# Patient Record
Sex: Male | Born: 1970 | Race: White | Hispanic: No | Marital: Married
Health system: Midwestern US, Community
[De-identification: ages and names within clinical notes are randomized; demographics above are authoritative.]

---

## 2015-07-15 ENCOUNTER — Inpatient Hospital Stay
Admit: 2015-07-15 | Discharge: 2015-07-15 | Disposition: A | Discharge status: Home / Self Care | Payer: TRICARE (CHAMPUS) | Attending: Emergency Medicine

## 2015-07-15 DIAGNOSIS — M75101 Unspecified rotator cuff tear or rupture of right shoulder, not specified as traumatic: Secondary | ICD-10-CM

## 2015-07-15 MED ORDER — DIAZEPAM 5 MG TAB
5 mg | ORAL | Status: AC
Start: 2015-07-15 — End: 2015-07-15
  Administered 2015-07-15: 21:00:00 via ORAL

## 2015-07-15 MED ORDER — NAPROXEN 500 MG TAB
500 mg | ORAL_TABLET | Freq: Two times a day (BID) | ORAL | 0 refills | Status: AC
Start: 2015-07-15 — End: 2015-07-25

## 2015-07-15 MED ORDER — CYCLOBENZAPRINE 5 MG TAB
5 mg | ORAL_TABLET | Freq: Three times a day (TID) | ORAL | 0 refills | Status: AC | PRN
Start: 2015-07-15 — End: ?

## 2015-07-15 MED FILL — DIAZEPAM 5 MG TAB: 5 mg | ORAL | Qty: 1

## 2015-07-15 NOTE — ED Triage Notes (Signed)
Patient arrived with c/o right upper back pain since Saturday.  [facility], has been taking OTC medications and can't sleep- "wants pain gone".

## 2015-07-15 NOTE — ED Notes (Signed)
Patient discharged by md. pt given the opportunity to ask questions, verbalized understanding of teaching.

## 2015-07-15 NOTE — ED Provider Notes (Signed)
HPI Comments: 44 y.o. male with no significant past medical history who presents from home via POV with chief complaint of back pain. Pt reports that he's been experiencing pain around the right shoulder blade since the evening of 07/12/15. He relates the pain is worse with deep breathing and turning his head to the right. Pt had chiropractor adjustments on 07/13/15 as well as today without relief. He denies any precipitating injury, leg swelling, or SOB. Pt notes that he commutes 5 hours today. Denies hx of VTE. There are no other acute medical concerns at this time.    Significant FMHx: -fhx of clots  PCP: None    Note written by Lillia Mountain, Scribe, as dictated by Tollie Pizza. Gemayel Mascio, MD 4:01 PM    The history is provided by the patient. No language interpreter was used.        No past medical history on file.    No past surgical history on file.      No family history on file.    Social History     Social History   ??? Marital status: MARRIED     Spouse name: N/A   ??? Number of children: N/A   ??? Years of education: N/A     Occupational History   ??? Not on file.     Social History Main Topics   ??? Smoking status: Not on file   ??? Smokeless tobacco: Not on file   ??? Alcohol use Not on file   ??? Drug use: Not on file   ??? Sexual activity: Not on file     Other Topics Concern   ??? Not on file     Social History Narrative         ALLERGIES: Review of patient's allergies indicates no known allergies.    Review of Systems   Constitutional: Negative for chills and fever.   HENT: Negative for ear pain and sore throat.    Eyes: Negative for pain.   Respiratory: Negative for chest tightness and shortness of breath.    Cardiovascular: Negative for chest pain and leg swelling.   Gastrointestinal: Negative for abdominal pain, nausea and vomiting.   Genitourinary: Negative for dysuria and flank pain.   Musculoskeletal: Positive for back pain.   Skin: Negative for rash.   Neurological: Negative for headaches.    All other systems reviewed and are negative.      Vitals:    07/15/15 1523   BP: 133/87   Pulse: 71   Resp: 16   Temp: 97.9 ??F (36.6 ??C)   SpO2: 97%   Weight: 84.4 kg (186 lb)   Height:  (1.778 m)            Physical Exam   Constitutional: He is oriented to person, place, and time. He appears well-developed and well-nourished. No distress.   HENT:   Head: Normocephalic and atraumatic.   Eyes: Pupils are equal, round, and reactive to light. No scleral icterus.   Neck: Normal range of motion. Neck supple. No tracheal deviation present.   Cardiovascular: Normal rate, regular rhythm and normal heart sounds.  Exam reveals no gallop and no friction rub.    No murmur heard.  Radial pulses 2+   Pulmonary/Chest: Effort normal and breath sounds normal. No respiratory distress. He has no wheezes. He has no rales.   Abdominal: Soft. He exhibits no distension. There is no tenderness. There is no rebound and no guarding.   Musculoskeletal: He  exhibits tenderness. He exhibits no edema.   Weakness in supraspinatus testing. Normal strength with internal and external rotation. -Hawkins. +Neer's.  Tenderness to medial scapula on the right.   Neurological: He is alert and oriented to person, place, and time.   Motor and sensation intact on radial and ulnar distribution. Nl strength.   Skin: Skin is warm and dry.   Psychiatric: He has a normal mood and affect.   Nursing note and vitals reviewed.     Note written by Lillia Mountainhristopher D. Gatens, Scribe, as dictated by Tollie Pizzaimothy J. Antoine Primasitchner, MD 4:02 PM     MDM  ED Course       Procedures    Progress notes:  4:26 PM Will discharge to home with flexeril and naproxen to f/u with an orthopedist. Sling applied and valium administered here.    Orders:  Orders Placed This Encounter   ??? APPLY SLING;  SPECIFY ONE TIME STAT   ??? diazePAM (VALIUM) tablet 5 mg   ??? cyclobenzaprine (FLEXERIL) 5 mg tablet   ??? naproxen (NAPROSYN) 500 mg tablet     Disposition: Discharge  4:26 PM   Patient's results have been reviewed with them.  Patient and/or family have verbally conveyed their understanding and agreement of the patient's signs, symptoms, diagnosis, treatment and prognosis and additionally agree to follow up as recommended or return to the Emergency Room should their condition change prior to follow-up.  Discharge instructions have also been provided to the patient with some educational information regarding their diagnosis as well a list of reasons why they would want to return to the ER prior to their follow-up appointment should their condition change.       Impression: rotator cuff tendonitis, rhomboid muscle strain    Plain: Naprosyn, sling, follow up with Ortho

## 2020-06-10 IMAGING — MR MRI KNEE LT WO CONTRAST
4 of 5 series · 27 of 40 positions shown · non-contrast
Comparison: None available.

INDICATION: Left knee pain
TECHNIQUE: Multiplanar, multisequence imaging of the left knee was performed without contrast.

[Series 15: t2_axial_fs · axial · left · 3.0mm · 0.45mm/px · z∈[-28,+87]mm · 8 of 30 slices shown]
[im 1/30]
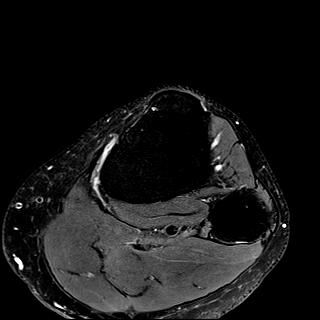
[im 4/30]
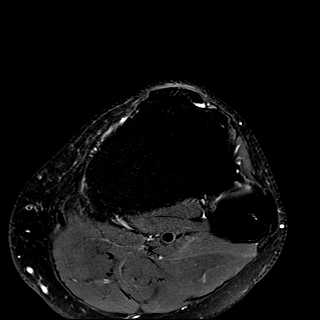
[im 10/30]
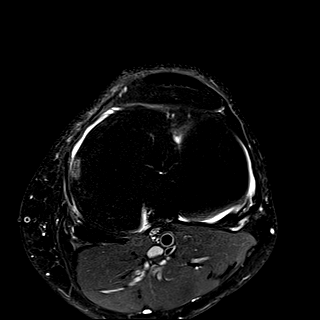
[im 13/30]
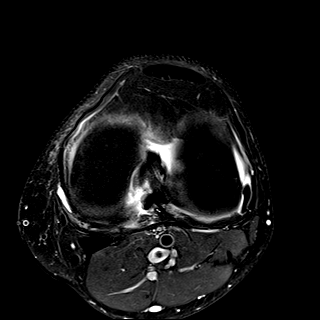
[im 17/30]
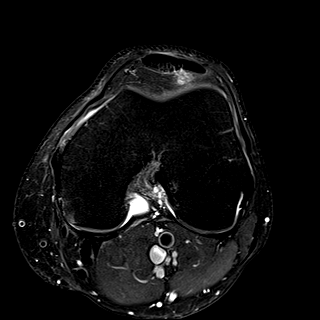
[im 20/30]
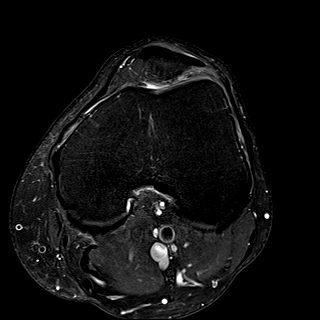
[im 26/30]
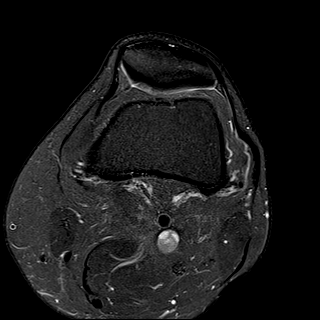
[im 30/30]
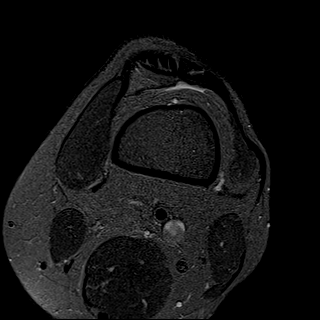

[Series 17: t1_cor · coronal · left · 3.5mm · 0.36mm/px · 7 of 24 slices shown]
[im 1/24]
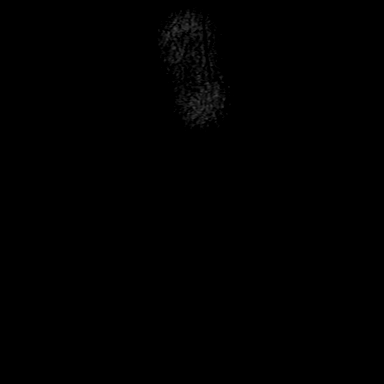
[im 4/24]
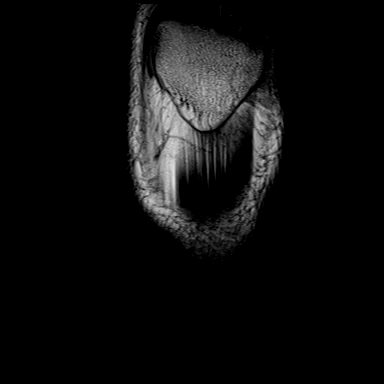
[im 8/24]
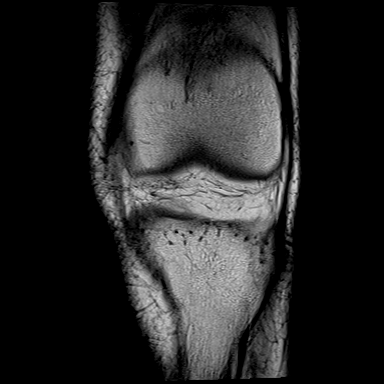
[im 12/24]
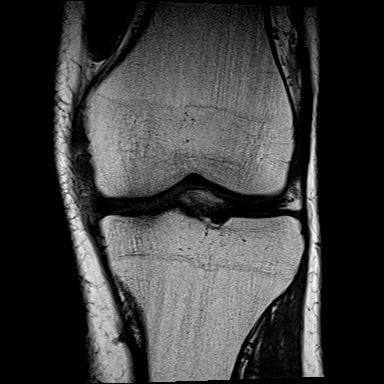
[im 16/24]
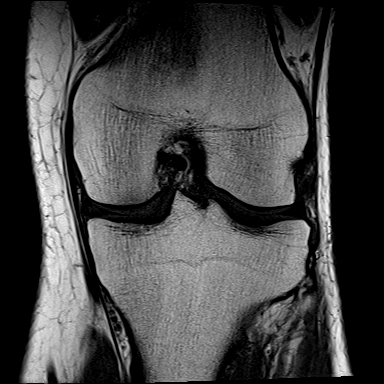
[im 20/24]
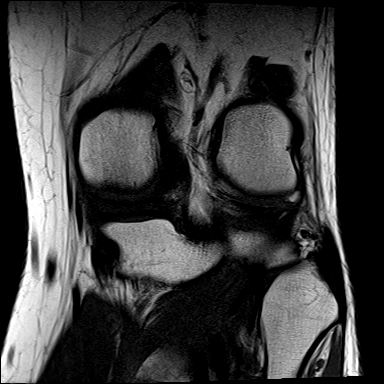
[im 24/24]
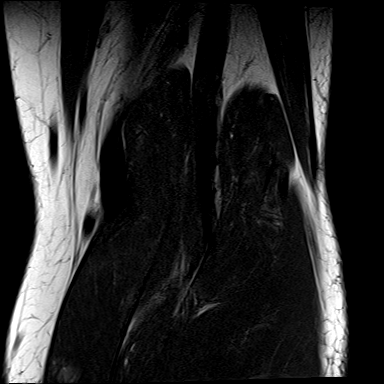

[Series 18: t2_cor_fs · coronal · left · 3.5mm · 0.44mm/px · 7 of 24 slices shown]
[im 1/24]
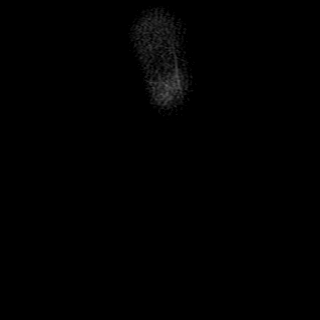
[im 4/24]
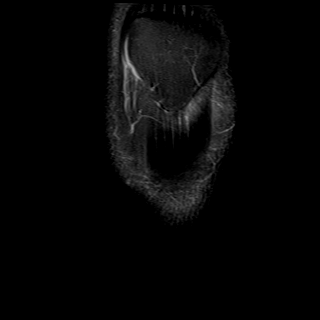
[im 8/24]
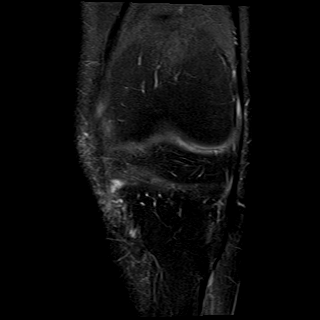
[im 12/24]
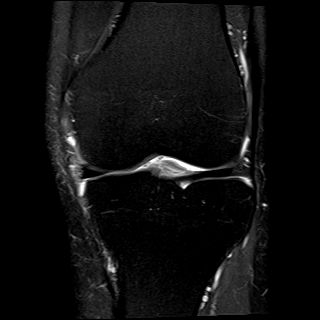
[im 16/24]
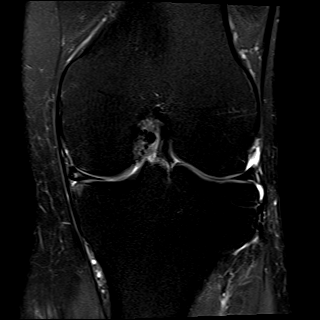
[im 20/24]
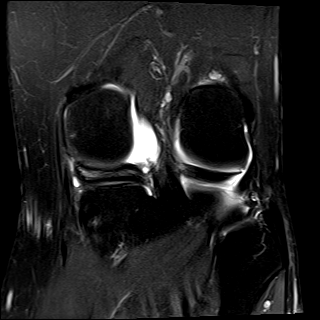
[im 24/24]
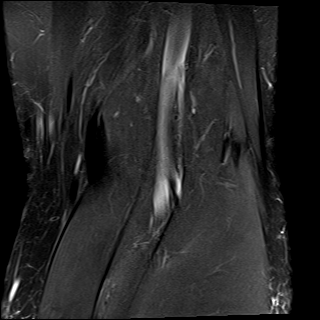

[Series 1019: pd_sag_fs · sagittal · left · 3.0mm · 0.23mm/px · 5 of 28 slices shown]
[im 1/28]
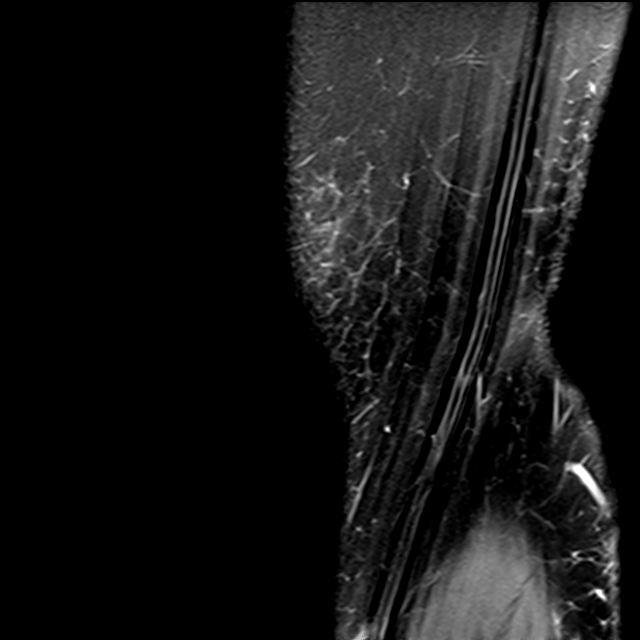
[im 4/28]
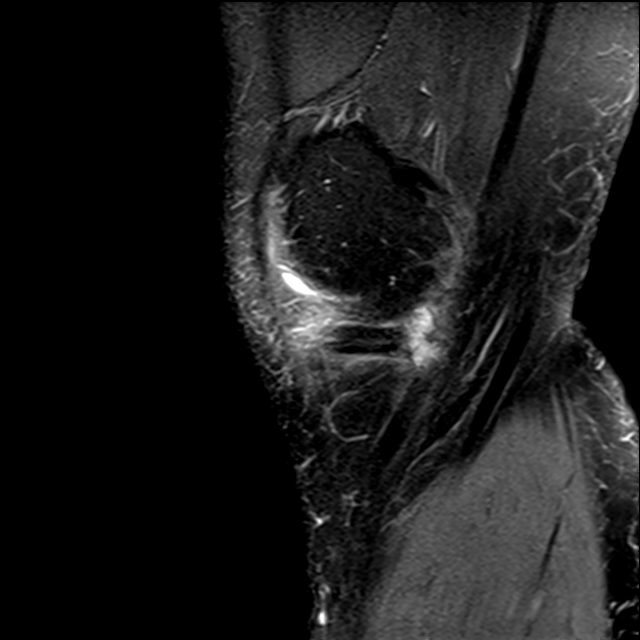
[im 8/28]
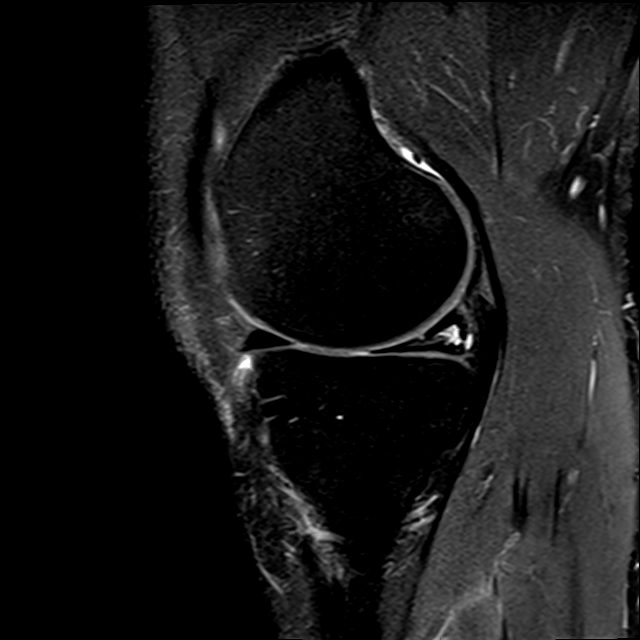
[im 16/28]
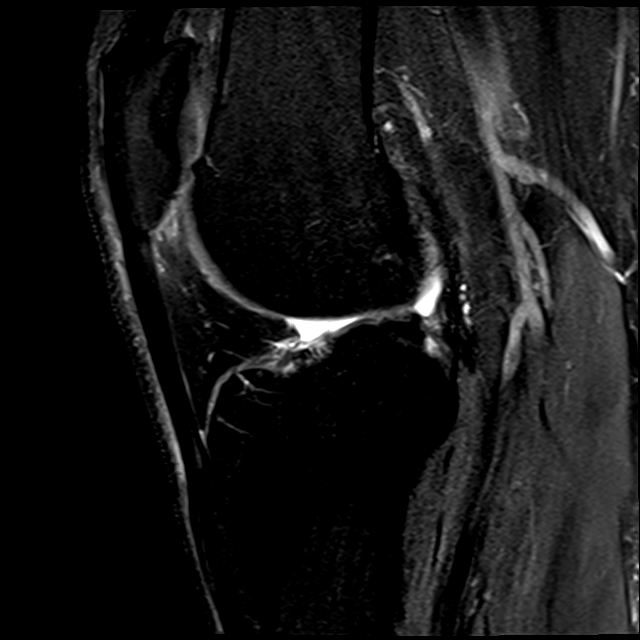
[im 24/28]
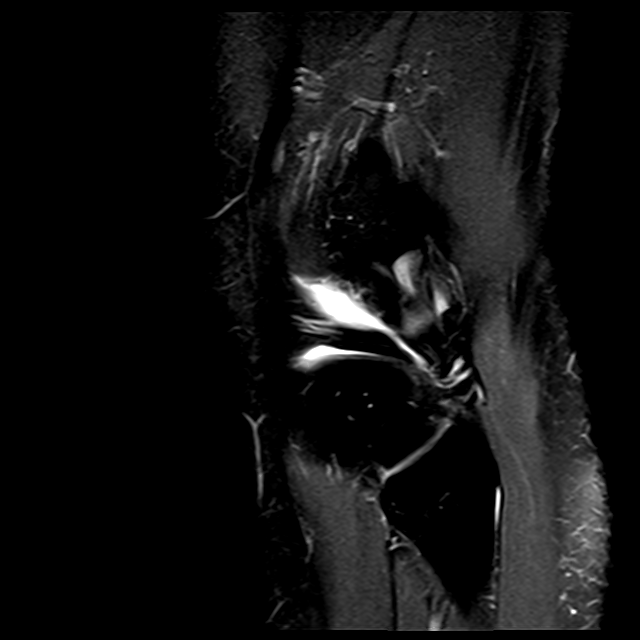

[27 of 40 positions shown; findings below may reference images not displayed]

FINDINGS: OSSEOUS: No acute fracture, avascular necrosis or aggressive osseous lesion.

MEDIAL JOINT COMPARTMENT:

Medial meniscus: Horizontal tearing of the posterior horn of the medial meniscus is present, with a multiloculated parameniscal cyst measuring 22 x 12 mm. Background myxoid degeneration of the posterior horn and body of the medial meniscus. Additional small superior surface tear of the anterior body of the medial meniscus.

Articular cartilage: Small region of high-grade chondrosis along the peripheral aspect the weightbearing medial tibial plateau, subjacent to the medial meniscal body. Associated with mild underlying subchondral marrow edema.

LATERAL JOINT COMPARTMENT:

Lateral meniscus: Intact.

Articular cartilage: Intact.

PATELLOFEMORAL JOINT AND EXTENSOR MECHANISM:

Quadriceps tendon: The visualized portion of the distal quadriceps tendon is intact.

Patella tendon: Mild proximal patellar tendinosis.

Articular cartilage: Multifocal superficial partial-thickness chondral fissuring of the median patellar ridge and medial patellar facet, without underlying reactive osseous change.

Alignment: The alignment of the patellofemoral joint is within normal limits, in the imaged position.

LIGAMENTS:

Anterior cruciate ligament: Intact.

Posterior cruciate ligament: Intact.

Medial collateral ligament: Intact.

Lateral collateral ligament: Intact.

MUSCULOTENDINOUS: The visualized musculotendinous soft tissues about the knee are unremarkable.

OTHER: Small amount of knee joint fluid. Moderate focal edema is present within the superolateral aspect of Hoffa's fat. Trace fluid within the popliteal recess.
IMPRESSION: 1.
Large horizontal tear at the posterior horn of the medial meniscus, with associated parameniscal cyst. Mild medial compartment chondrosis.

2.
Mild patellofemoral joint chondrosis.

3.
Edema within the superolateral aspect of Hoffa's fat which can be seen in setting of patellar maltracking/Hoffa's fat pad impingement.

## 2020-07-29 IMAGING — CT CTA CHEST WO-W CONTRAST
3 of 7 series · 18 of 36 positions shown · IV contrast (agent unspecified)
Comparison: None.

HISTORY: 49-year-old male with chest pain, shortness of breath and palpitations.
TECHNIQUE: A CTA chest study was performed without and with IV contrast using axial images. Sagittal and coronal reconstructed images were obtained. Multiplanar MIP and 3-D reconstructions were created. 100 ml of Rsovue-0NB intravenous contrast was administered.

[Series 7: angio · axial · 0.76mm/px · z∈[+1514,+1781]mm · 14 of 309 slices shown]
[im 21/309  lung]
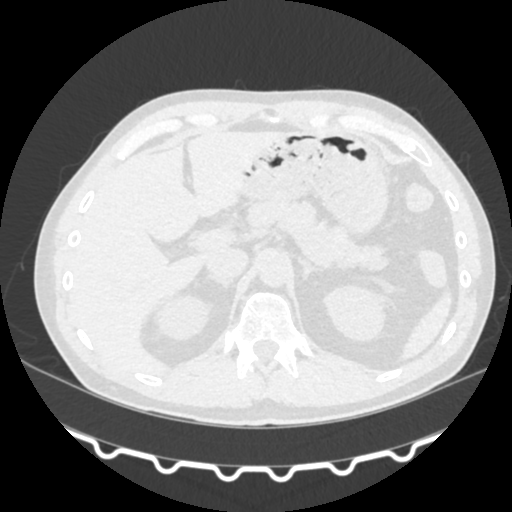
[im 42/309  mediastinal]
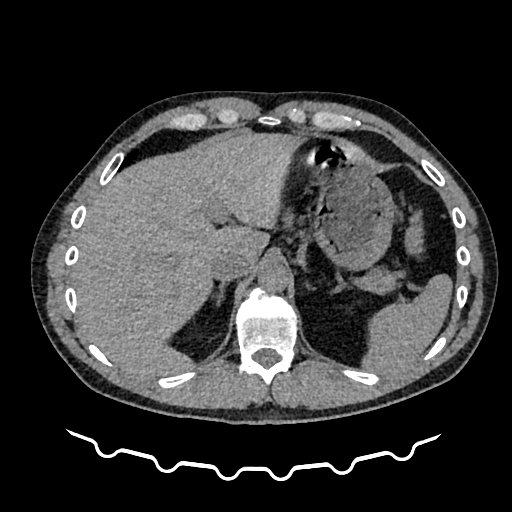
[im 62/309  lung]
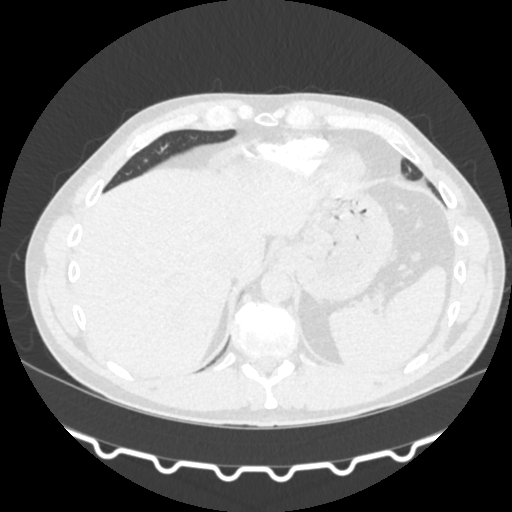
[im 83/309  mediastinal]
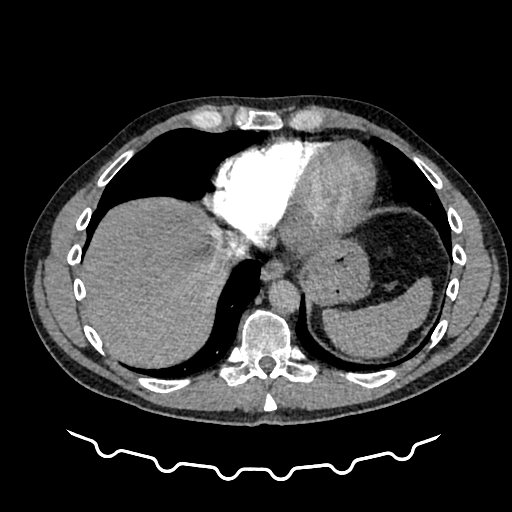
[im 103/309  lung]
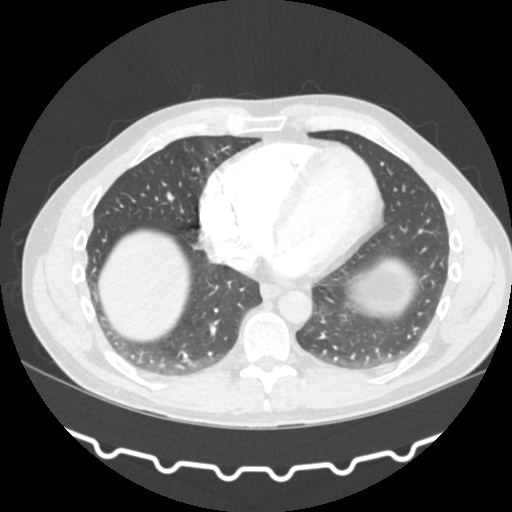
[im 124/309  mediastinal]
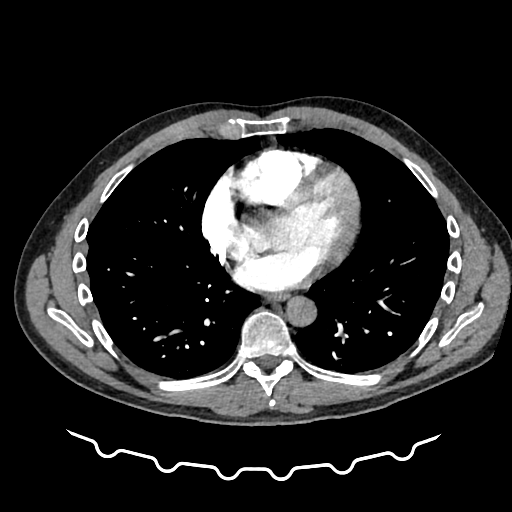
[im 144/309  lung]
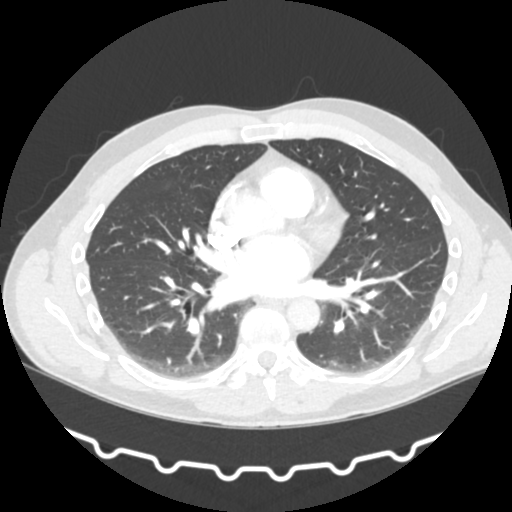
[im 165/309  mediastinal]
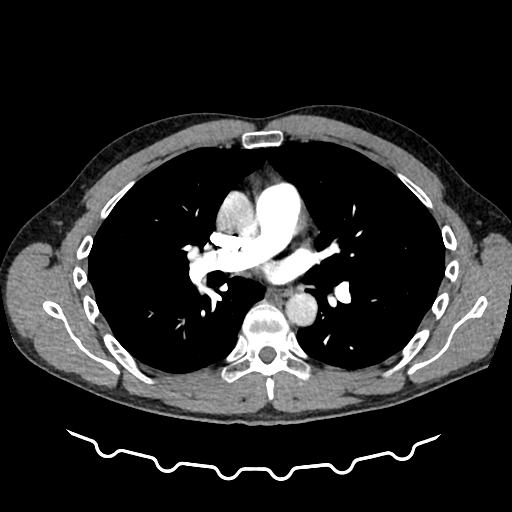
[im 185/309  lung]
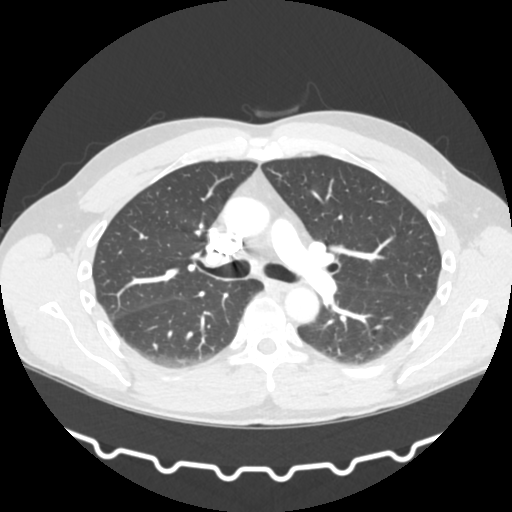
[im 206/309  mediastinal]
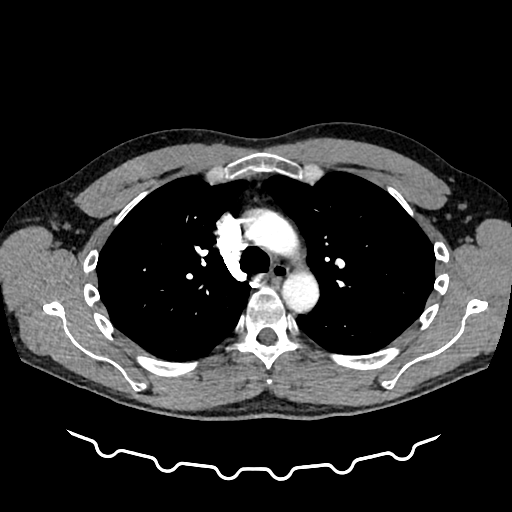
[im 226/309  lung]
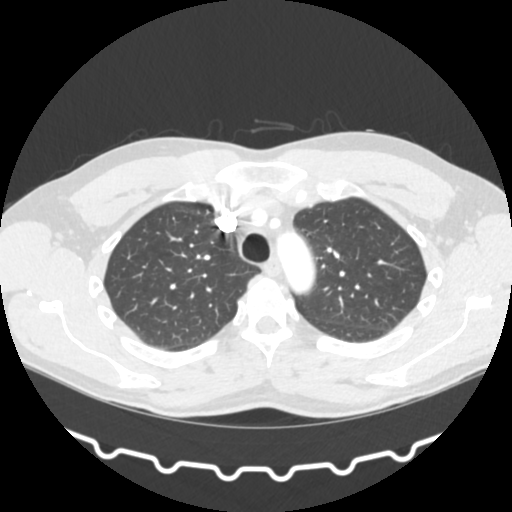
[im 247/309  mediastinal]
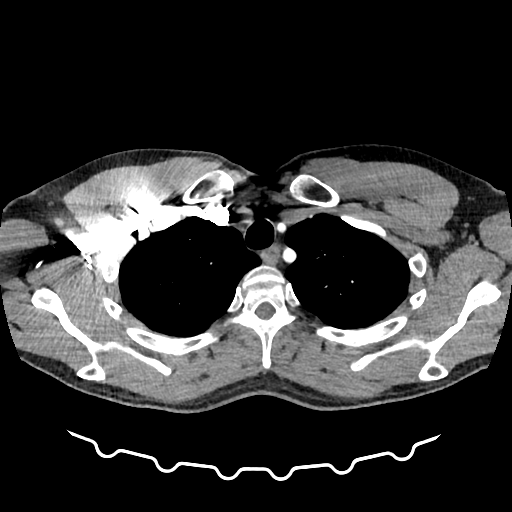
[im 267/309  lung]
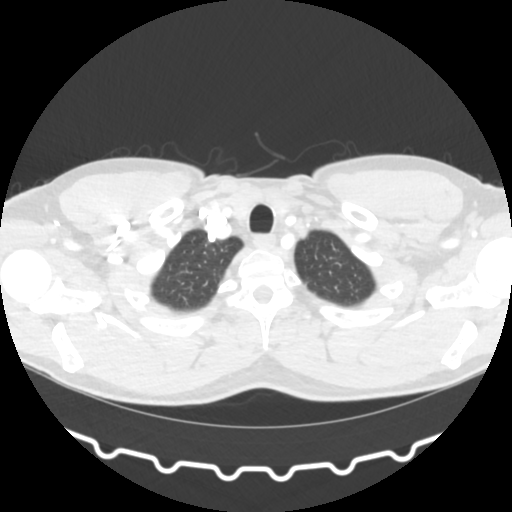
[im 288/309  mediastinal]
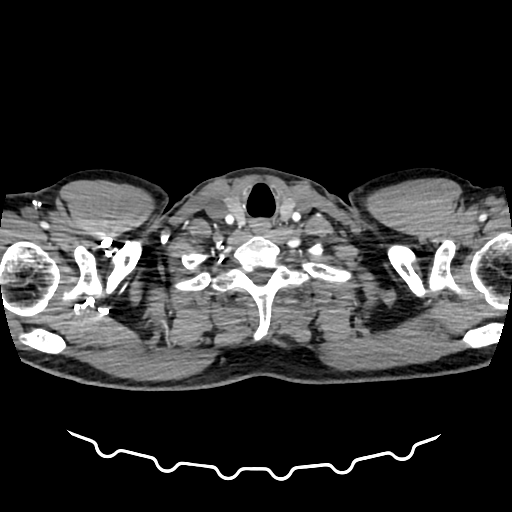

[Series 9: lung · axial · 0.55mm/px · 1 of 155 slices shown]
[im 23/155  mediastinal]
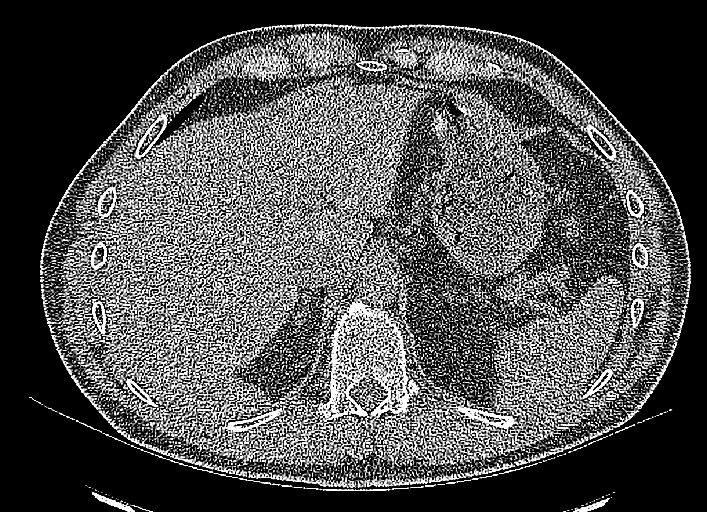

[Series 11: coronal · coronal · 0.61mm/px · 3 of 122 slices shown]
[im 25/122  mediastinal]
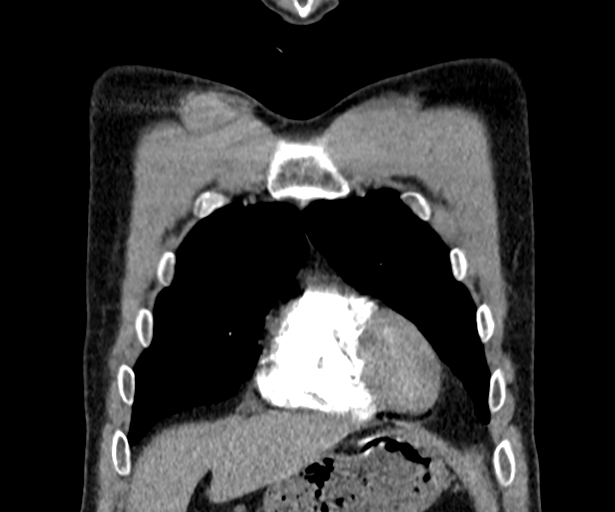
[im 49/122  mediastinal]
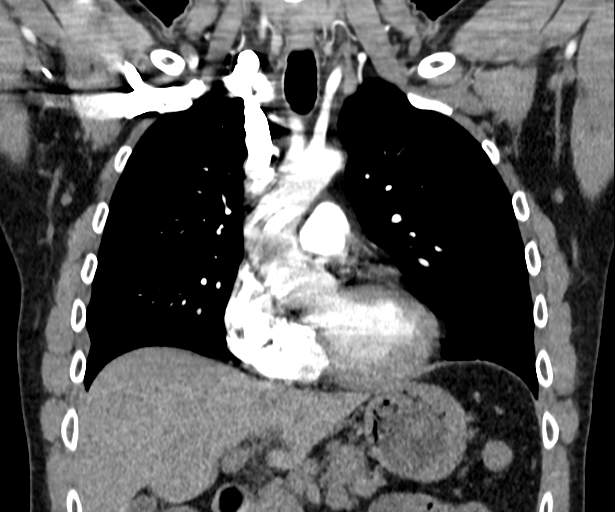
[im 73/122  mediastinal]
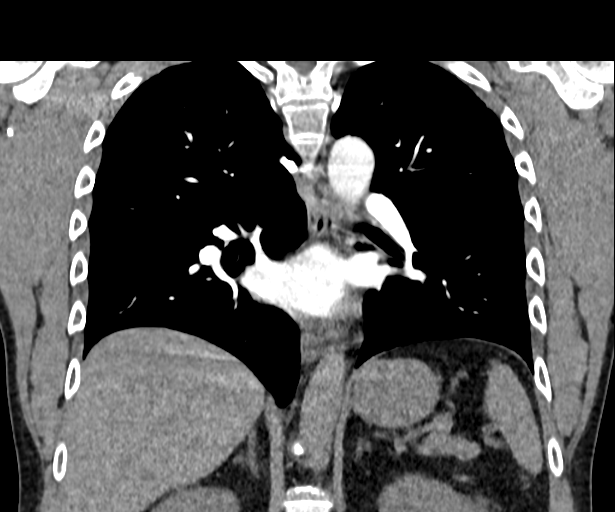

[18 of 36 positions shown; findings below may reference images not displayed]

FINDINGS: Visualized lower neck: The thyroid is unremarkable. No suspicious lesion.

Axilla: No lymphadenopathy.

Mediastinum/hila: No lymphadenopathy.

Cardiovascular: No cardiomegaly or pericardial effusion.

Pulmonary vessels: No filling defect to suggest embolism. No dilation of the pulmonary artery.

Upper abdomen: Nonobstructive calculus in the upper pole of the left kidney measuring 5 mm.

Osseous structures: No suspicious lesion or acute fracture. Bilateral cervical ribs. Chronic mild compression deformity of the superior endplate of T7.

Pleura: No pleural effusion or thickening. No pneumothorax.

Lungs: No pulmonary nodule, mass or consolidation.
IMPRESSION: No evidence of acute pulmonary embolism. No acute process.

Chronic mild compression deformity of T7.

Nonobstructive 5 mm left upper renal calculus.

Total radiation dose to patient is CTDIvol 11.64 mGy and DLP 288.86 mGy-cm.

STAT Fax

## 2021-05-11 IMAGING — MR MRI KNEE LT WO CONTRAST
4 of 5 series · 27 of 40 positions shown · non-contrast
Comparison: MRI of the left knee, 06/10/2020.

INDICATION: Meniscus tear
TECHNIQUE: Multiplanar, multisequence imaging of the left knee was performed without contrast.

[Series 8: t2_axial_fs · axial · left · 3.0mm · 0.47mm/px · z∈[-55,+76]mm · 8 of 34 slices shown]
[im 1/34]
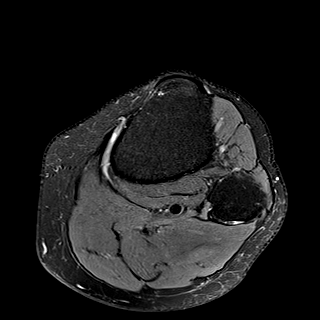
[im 4/34]
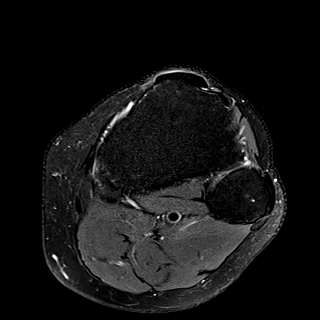
[im 12/34]
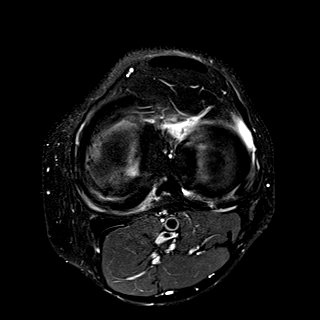
[im 15/34]
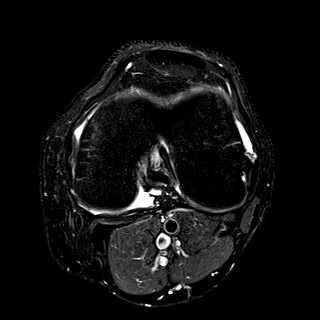
[im 19/34]
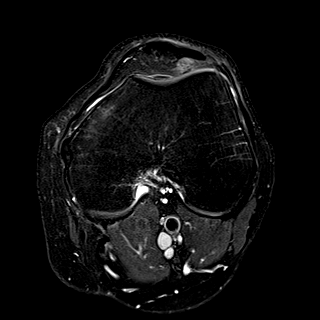
[im 23/34]
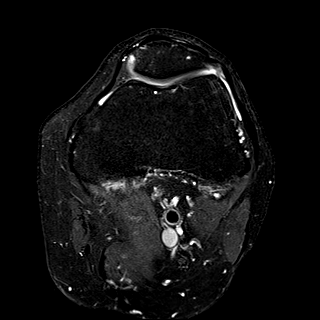
[im 30/34]
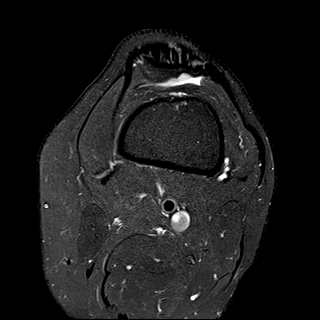
[im 34/34]
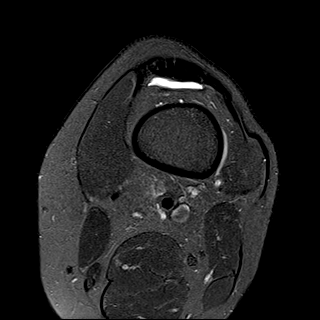

[Series 10: t1_cor · coronal · left · 3.5mm · 0.40mm/px · 7 of 28 slices shown]
[im 1/28]
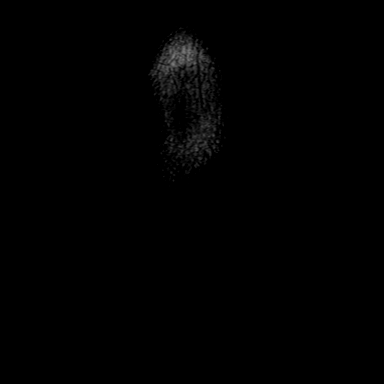
[im 5/28]
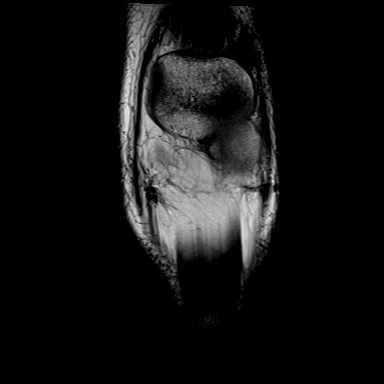
[im 10/28]
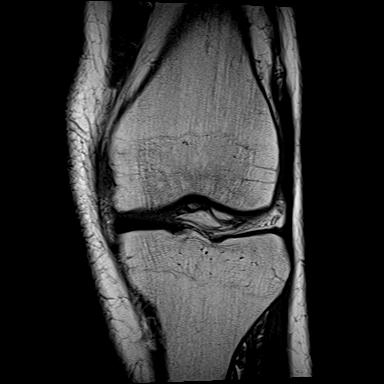
[im 14/28]
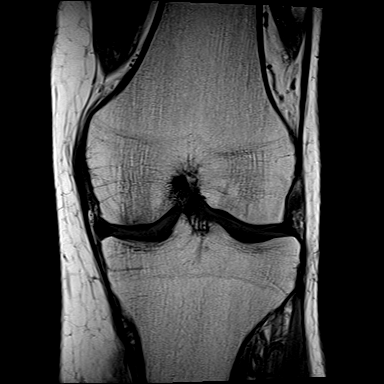
[im 19/28]
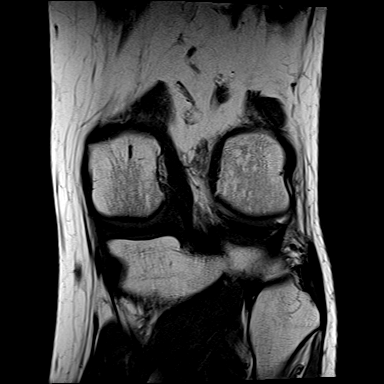
[im 23/28]
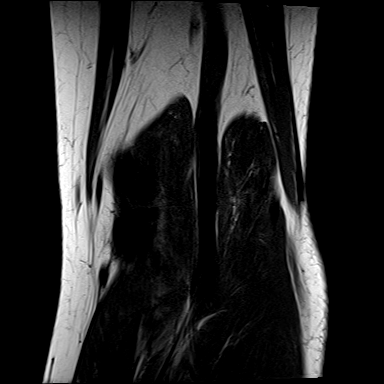
[im 28/28]
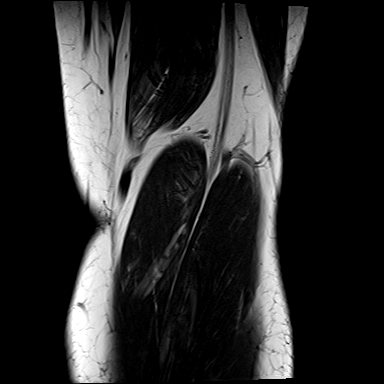

[Series 11: t2_cor_fs · coronal · left · 3.5mm · 0.48mm/px · 7 of 28 slices shown]
[im 1/28]
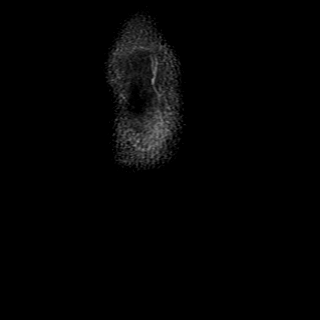
[im 5/28]
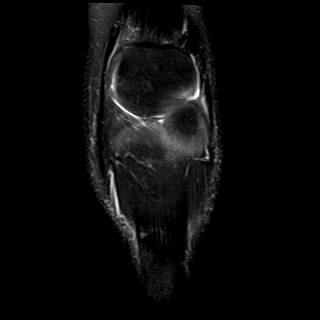
[im 10/28]
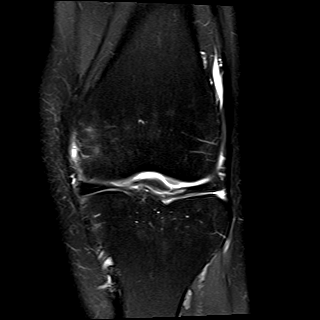
[im 14/28]
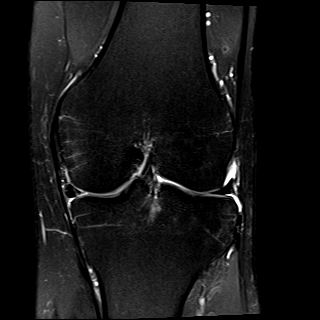
[im 19/28]
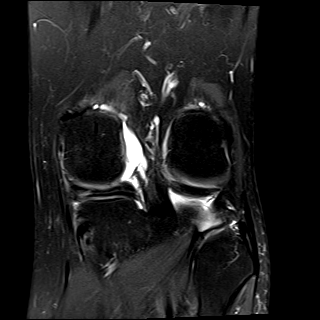
[im 23/28]
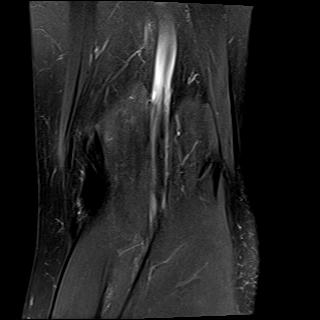
[im 28/28]
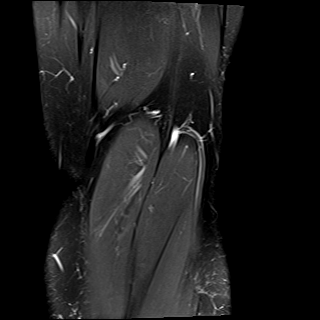

[Series 1027: pd_sag_fs_new · sagittal · left · 3.0mm · 0.25mm/px · 5 of 32 slices shown]
[im 1/32]
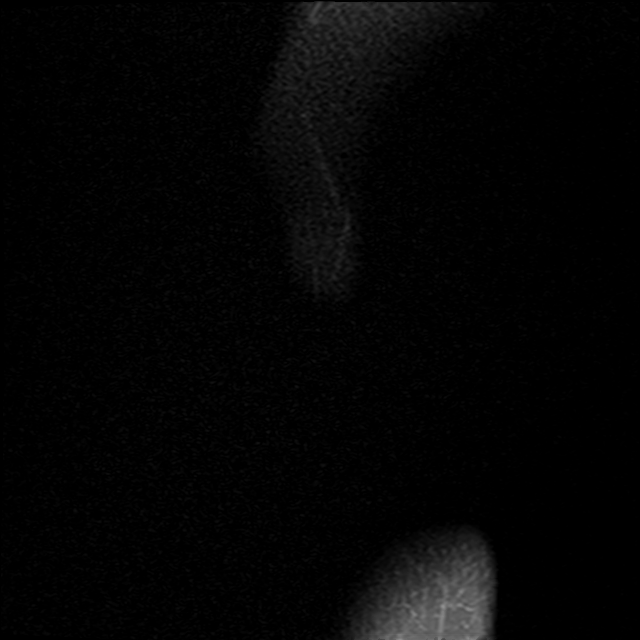
[im 5/32]
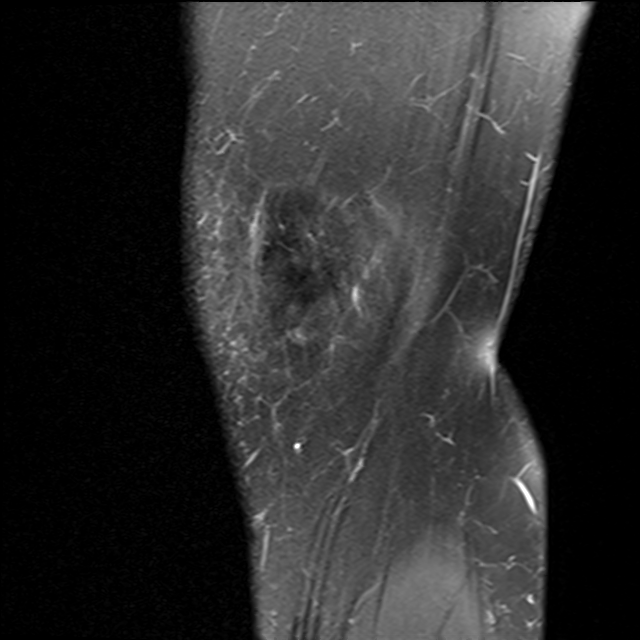
[im 9/32]
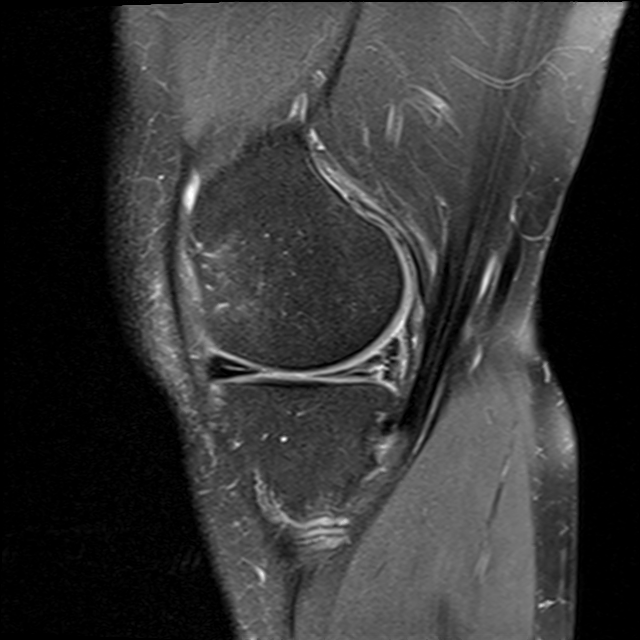
[im 18/32]
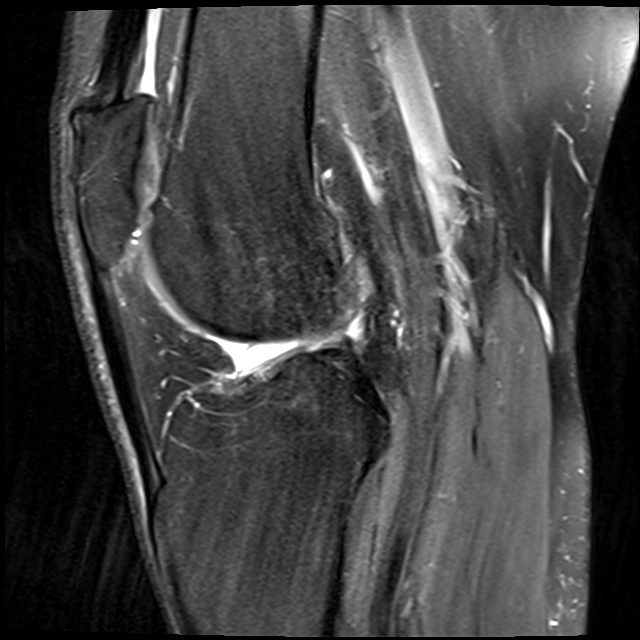
[im 27/32]
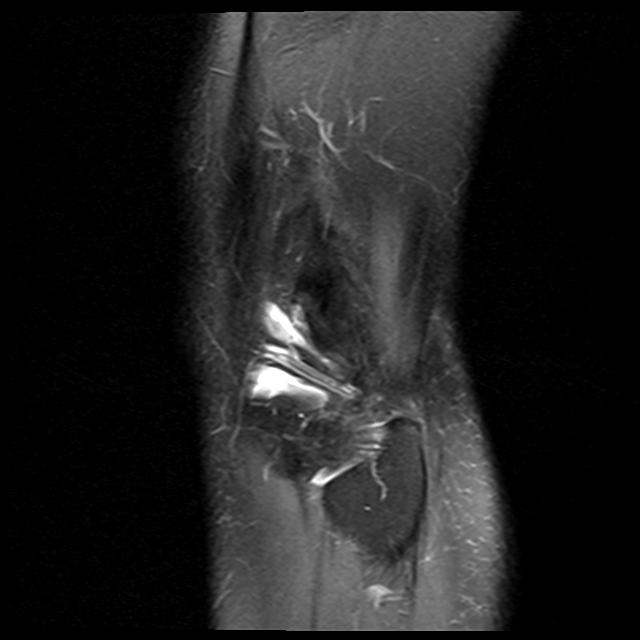

[27 of 40 positions shown; findings below may reference images not displayed]

FINDINGS: OSSEOUS: No acute fracture, avascular necrosis or aggressive osseous lesion.

MEDIAL JOINT COMPARTMENT:

Medial meniscus: Interval truncation of the central free edge of the posterior horn of the medial meniscus near the posterior horn/body junction with truncation of the central free edge of the body, most compatible with interval partial medial meniscectomy. Redemonstration of intrasubstance degeneration of the posterior horn, with resolution of associated parameniscal cysts. Development of linear intermediate high signal intensity which contacts the inferior articular surface of the central to peripheral thirds of the posterior horn which could represent small inferior surface tear. Development of linear horizontal intermediate signal within the anterior horn/root junction which extends to the free edge on a single image near the anterior horn/body junction.

Articular cartilage: Redemonstration of high-grade chondrosis along the peripheral weightbearing medial tibial plateau with resolution of underlying subchondral marrow edema. The medial compartment articular cartilage is otherwise intact.

LATERAL JOINT COMPARTMENT:

Lateral meniscus: Intact.

Articular cartilage: Intact.

PATELLOFEMORAL JOINT AND EXTENSOR MECHANISM:

Quadriceps tendon: The visualized portion of the distal quadriceps tendon is intact.

Patella tendon: Mild tendinosis, unchanged. 

Articular cartilage: Multifocal superficial and deep partial-thickness chondral fissuring of the mid to superior portion of the median ridge extending into the medial patellar facet, similar in appearance to prior examination. No underlying reactive osseous change. The trochlear articular cartilage is intact.

Alignment: The alignment of the patellofemoral joint is within normal limits, in the imaged position.

Other: Redemonstration of edema in the superior lateral aspect of Hoffa's fat, decreased from prior examination.

LIGAMENTS:

Anterior cruciate ligament: Intact.

Posterior cruciate ligament: Intact.

Medial collateral ligament: Intact.

Lateral collateral ligament: Intact.

MUSCULOTENDINOUS: Mild semimembranosus tendinosis, progressed from prior examination. Remaining visualized musculotendinous soft tissues unremarkable in appearance.

OTHER: Small knee joint effusion. Trace fluid within the popliteal recess.
IMPRESSION: 1.
Evidence of interval partial medial meniscectomy, with possible small inferior surface tear versus postsurgical change of the posterior horn of the junction of the middle and peripheral thirds. No displaced meniscal flap.

2.
Development of linear intermediate signal extending to the free edge of the anterior horn/body junction of the medial meniscus on a single image, concerning for a small horizontal tear.

3.
Mild to moderate patellofemoral joint chondrosis, unchanged.

4.
Mild patellar tendinosis, unchanged. Edema within the superior lateral aspect of Hoffa's fat, decreased from prior examination. These findings can be seen in the setting of patellar maltracking/Hoffa's fat pad impingement.

## 2022-06-24 IMAGING — MR MRI KNEE LT WO CONTRAST
6 series · 40 of 40 positions shown · non-contrast
Comparison: 05/11/2021

Images Obtained from [facility]
MRI KNEE LT WO CONTRAST
INDICATION: other tear of medial meniscus, current injury, left knee, initial encounter
TECHNIQUE: Multiplanar, multiecho imaging of the left knee was performed, including T1-weighted and fluid sensitive sequences without intravenous contrast administration.

[Series 2: t2_axial_fs · axial · 4.0mm · 0.62mm/px · z∈[-78,+57]mm · 7 of 28 slices shown]
[im 1/28]
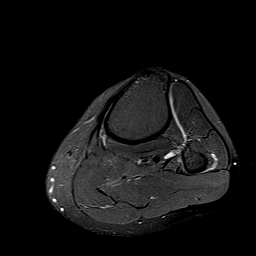
[im 5/28]
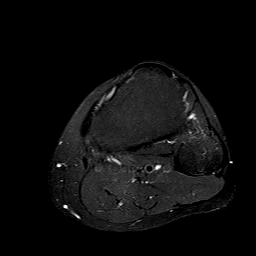
[im 10/28]
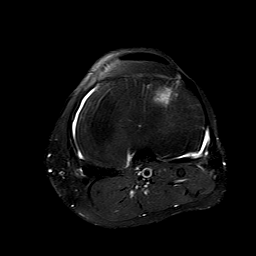
[im 14/28]
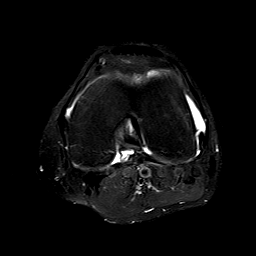
[im 19/28]
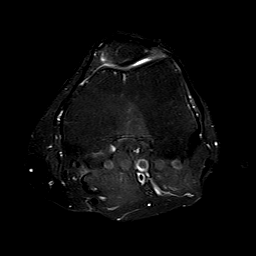
[im 23/28]
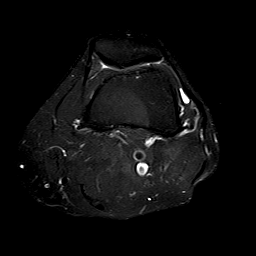
[im 28/28]
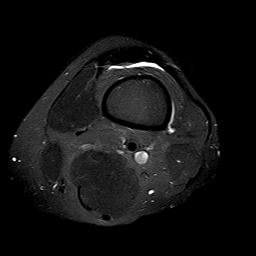

[Series 3: t2_axial_fs_r1 · axial · 4.0mm · 0.62mm/px · z∈[-78,+57]mm · 7 of 28 slices shown]
[im 1/28]
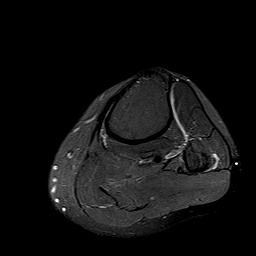
[im 5/28]
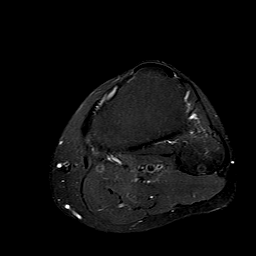
[im 10/28]
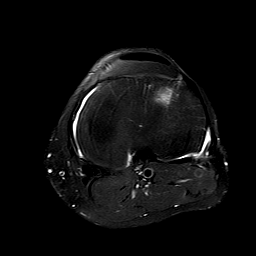
[im 14/28]
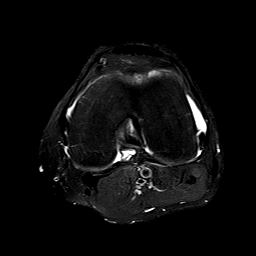
[im 19/28]
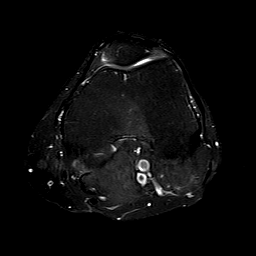
[im 23/28]
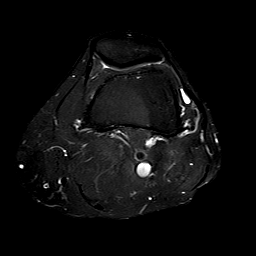
[im 28/28]
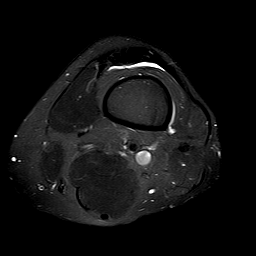

[Series 5: pd_sag_fs · sagittal · 3.0mm · 0.59mm/px · 7 of 28 slices shown]
[im 1/28]
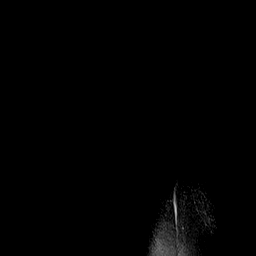
[im 5/28]
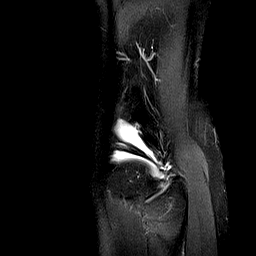
[im 10/28]
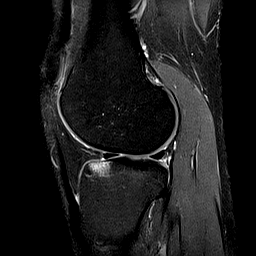
[im 14/28]
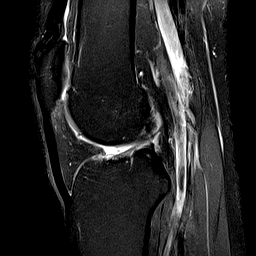
[im 19/28]
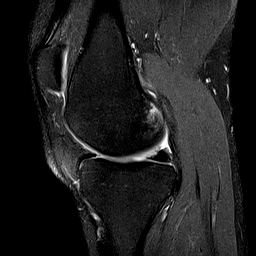
[im 23/28]
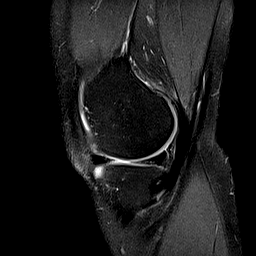
[im 28/28]
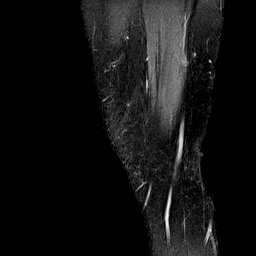

[Series 6: t2_sag_fs · sagittal · 3.0mm · 0.59mm/px · 7 of 28 slices shown]
[im 1/28]
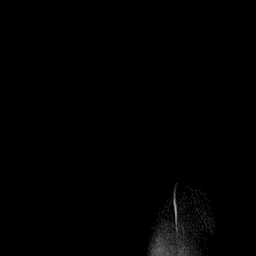
[im 5/28]
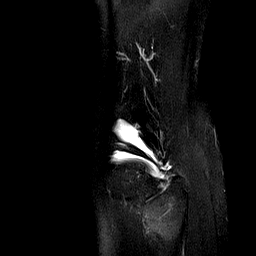
[im 10/28]
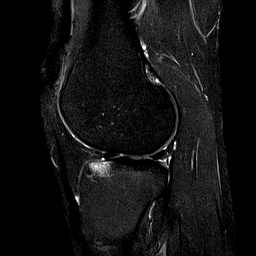
[im 14/28]
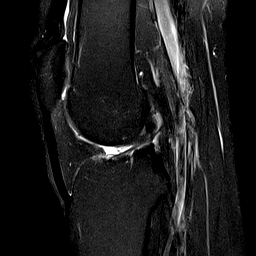
[im 19/28]
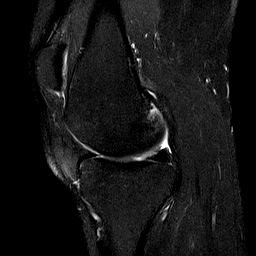
[im 23/28]
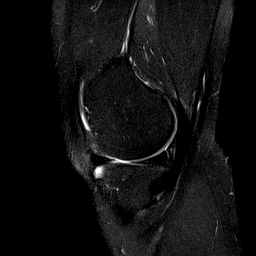
[im 28/28]
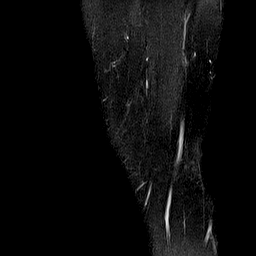

[Series 7: t1_cor · coronal · 4.0mm · 0.47mm/px · 6 of 22 slices shown]
[im 1/22]
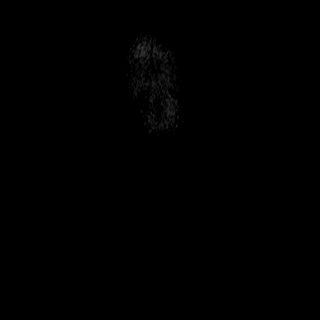
[im 5/22]
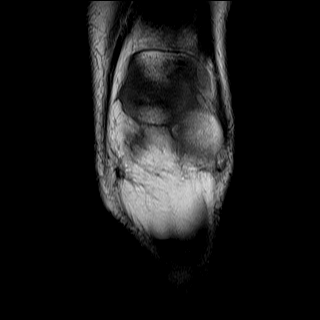
[im 9/22]
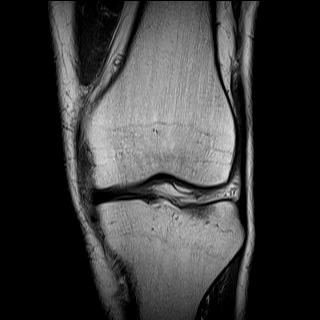
[im 13/22]
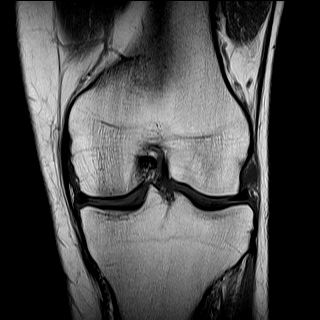
[im 17/22]
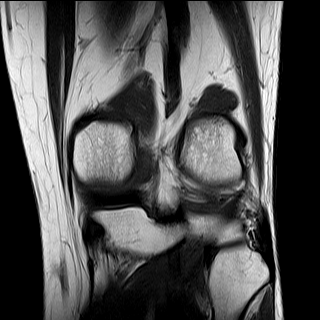
[im 22/22]
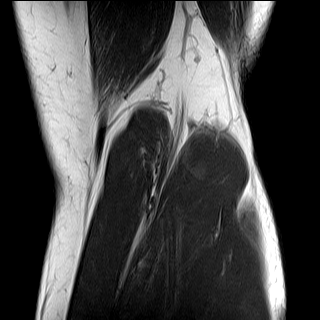

[Series 8: t2_cor_fs · coronal · 4.0mm · 0.47mm/px · 6 of 22 slices shown]
[im 1/22]
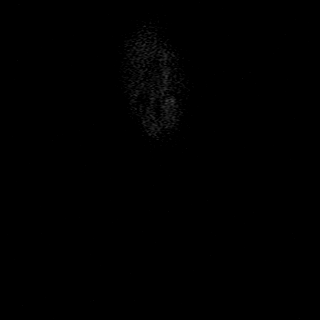
[im 5/22]
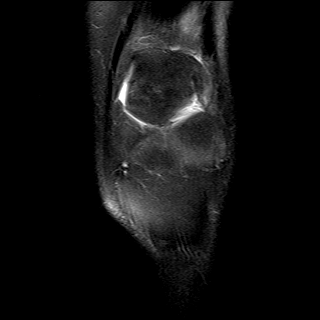
[im 9/22]
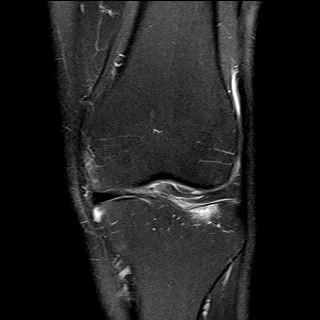
[im 13/22]
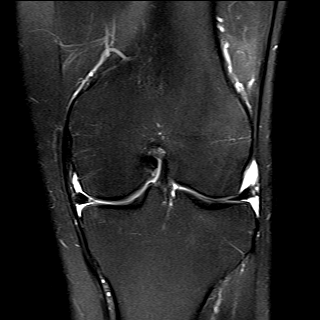
[im 17/22]
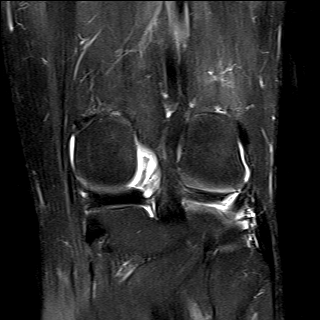
[im 22/22]
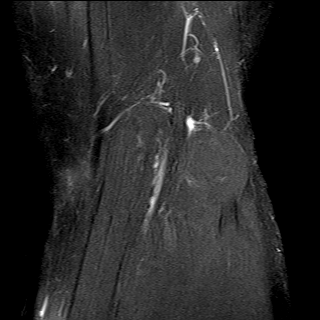

[40 of 40 positions shown; findings below may reference images not displayed]

FINDINGS: MEDIAL MENISCUS:  Postoperative changes posterior horn, unchanged. Persistent small vertically oriented tear posterior horn.
LATERAL MENISCUS:  Intact.
ACL:  Intact.
PCL:  Intact.
MCL:  Intact.
LATERAL LIGAMENTS AND TENDONS:  Intact.
EXTENSOR MECHANISM:  The quadriceps and patellar tendons are intact.
FAT PADS:   Mild edema superolateral aspect of Hoffa's fat. Correlate for infrapatellar impingement
CARTILAGE:
Patellofemoral compartment:  Moderate chondral surface irregularity similar to prior
Medial compartment:  Mild chondral surface irregularity
Lateral compartment: Mild chondral surface irregularity
BONE MARROW: Mild edema anteriorly within the lateral tibial condyle, probable contusion. No visible fractures. Remaining osseous structures demonstrate normal signal.
Physiologic amount of joint fluid.
No cystic or solid masses.
No muscle atrophy or denervation edema.
IMPRESSION: 1.  Postoperative changes posterior horn medial meniscus, unchanged from prior. Persistent small vertically oriented tear posterior horn medial meniscus.
2.  Moderate patellofemoral compartment chondral abnormalities, unchanged.
3.  Mild medial and lateral compartment chondral surface irregularity, unchanged.
4.  Mild edema anteriorly within the lateral tibial condyle, probable contusion. No visible fracture.

## 2022-11-17 IMAGING — MR MRI SHOULDER LT W CONTRAST
4 of 7 series · 17 of 40 positions shown · IV contrast (gadolinium)
Comparison: Same-day arthrogram images

Images Obtained from [facility]
HISTORY: Pain in left shoulder
TECHNIQUE: Multiplanar and multisequence MR imaging of the left shoulder was performed. Patient is immediately status post intra-articular injection of dilute gadolinium.

[Series 5: t2_axial_fs · axial · left · 3.0mm · 0.26mm/px · z∈[-30,+65]mm · 5 of 25 slices shown]
[im 1/25]
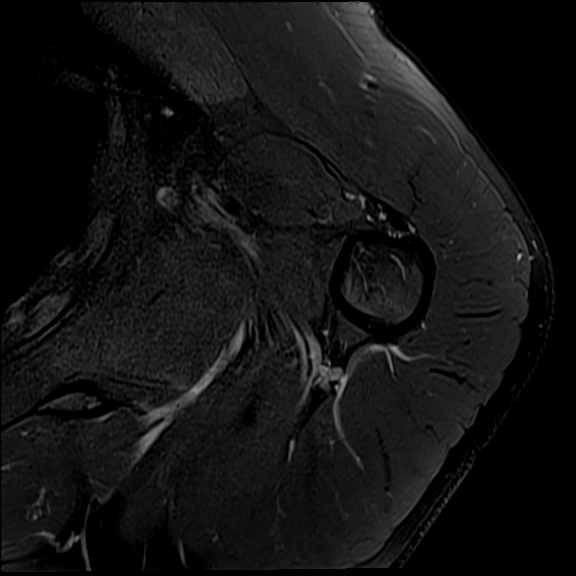
[im 7/25]
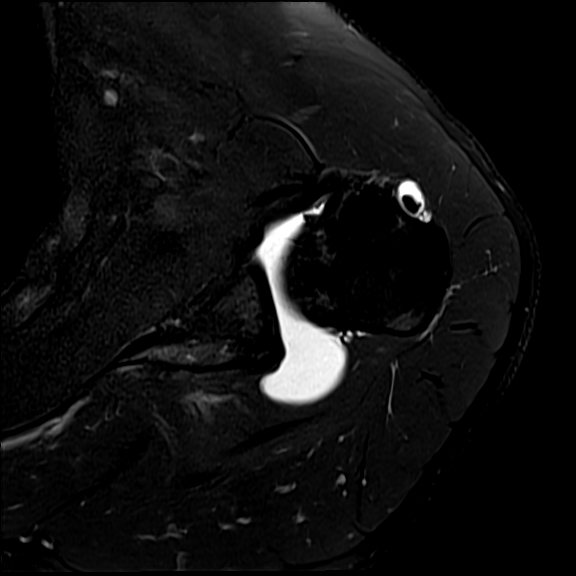
[im 13/25]
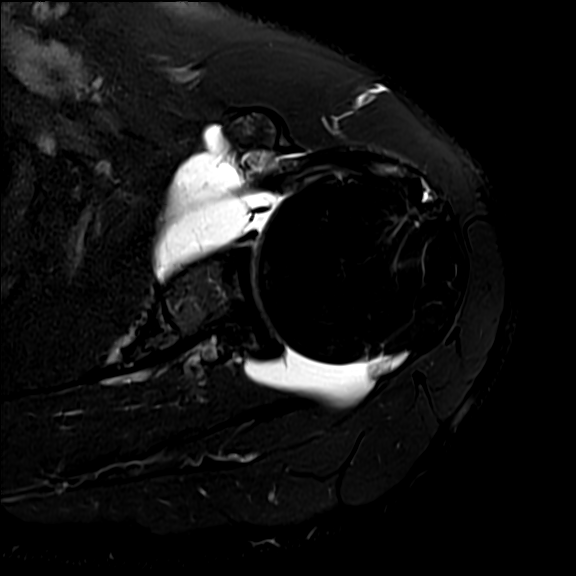
[im 19/25]
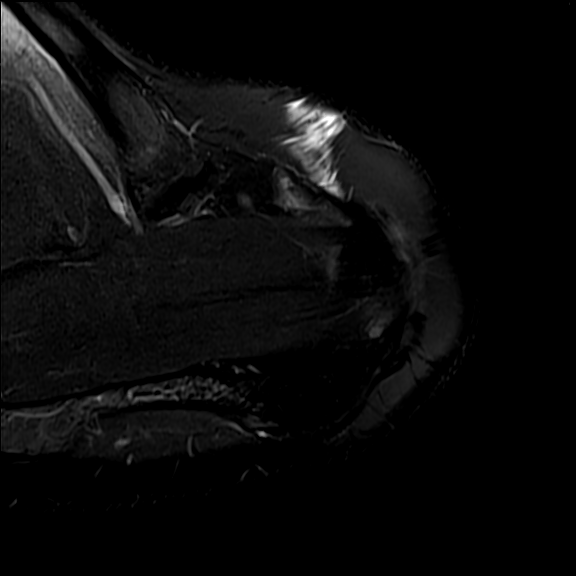
[im 25/25]
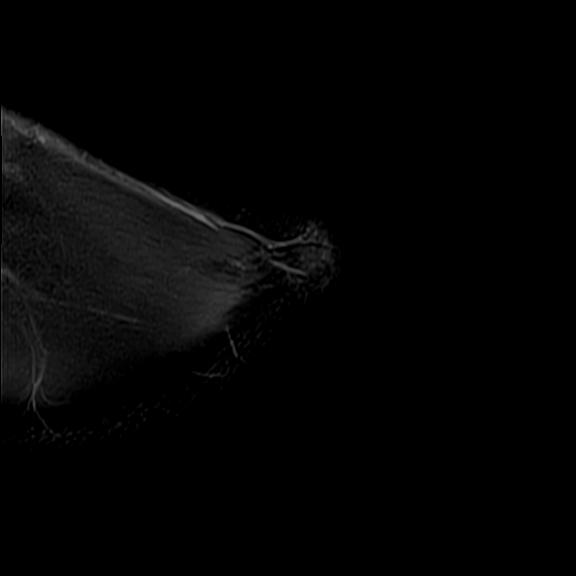

[Series 6: t1_axial_fs · axial · left · 3.0mm · 0.31mm/px · z∈[-29,+66]mm · 6 of 25 slices shown]
[im 1/25]
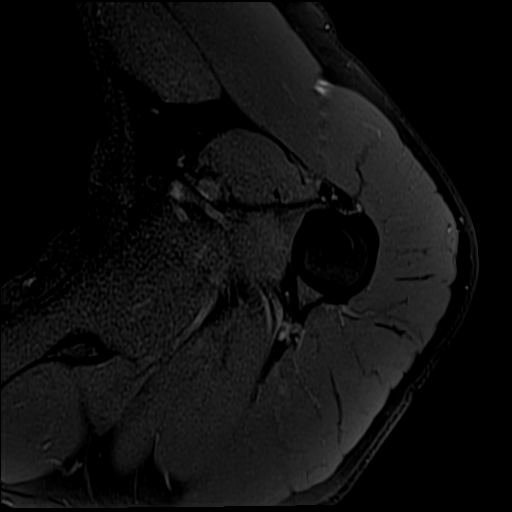
[im 5/25]
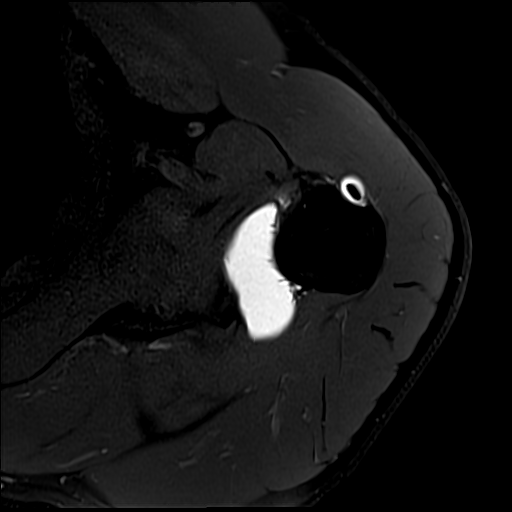
[im 10/25]
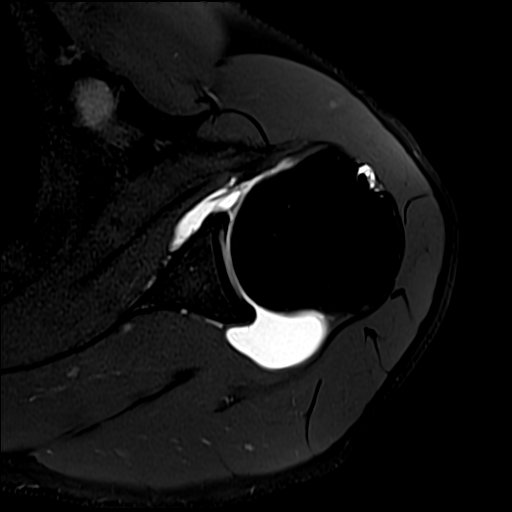
[im 15/25]
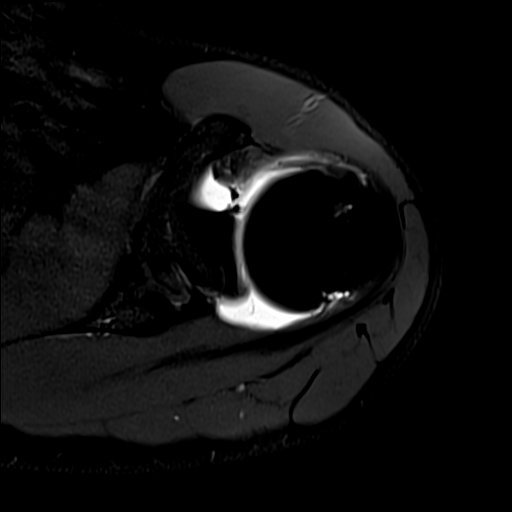
[im 20/25]
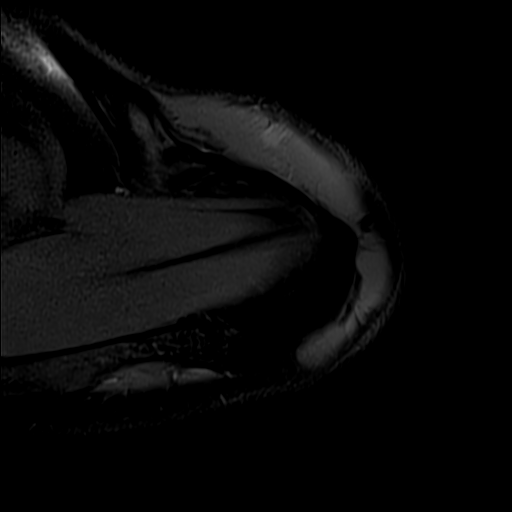
[im 25/25]
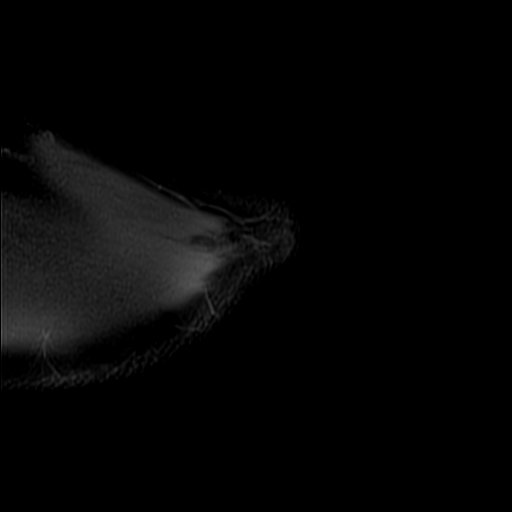

[Series 9: t2_sag_obl_fs · oblique · left · 3.0mm · 0.28mm/px · 3 of 28 slices shown]
[im 5/28]
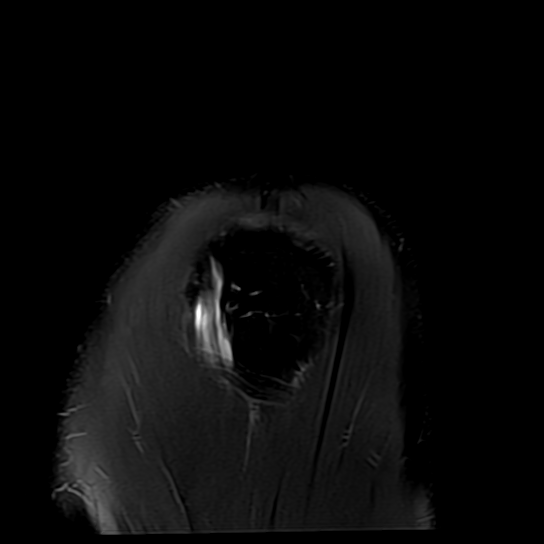
[im 14/28]
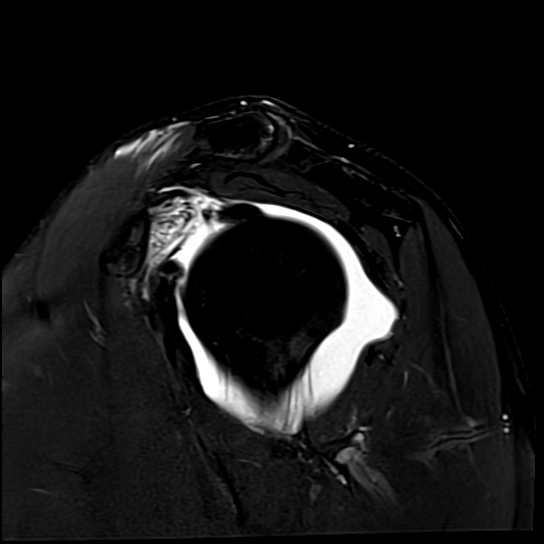
[im 23/28]
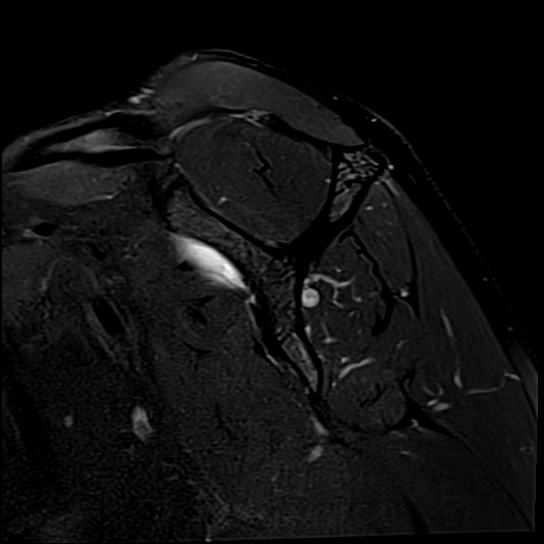

[Series 10: t1_sag_obl · oblique · left · 3.0mm · 0.26mm/px · 3 of 28 slices shown]
[im 5/28]
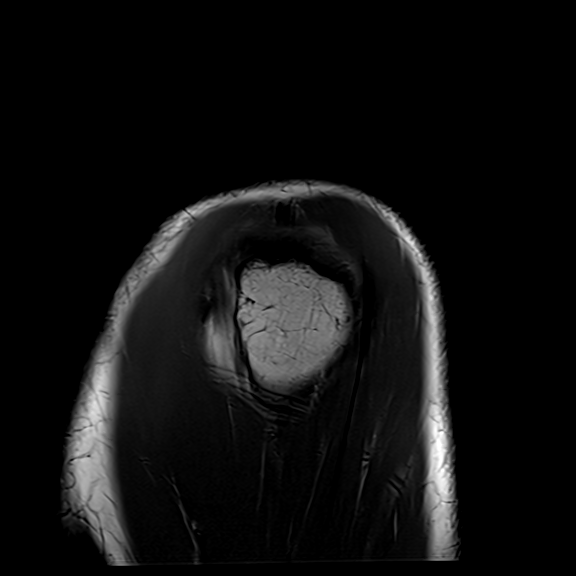
[im 14/28]
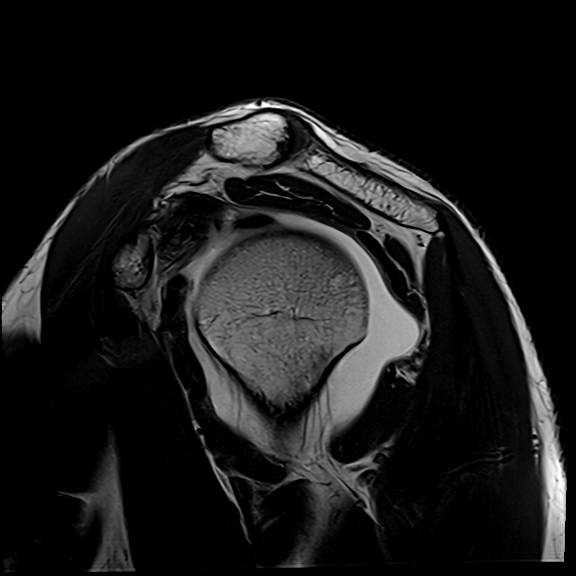
[im 23/28]
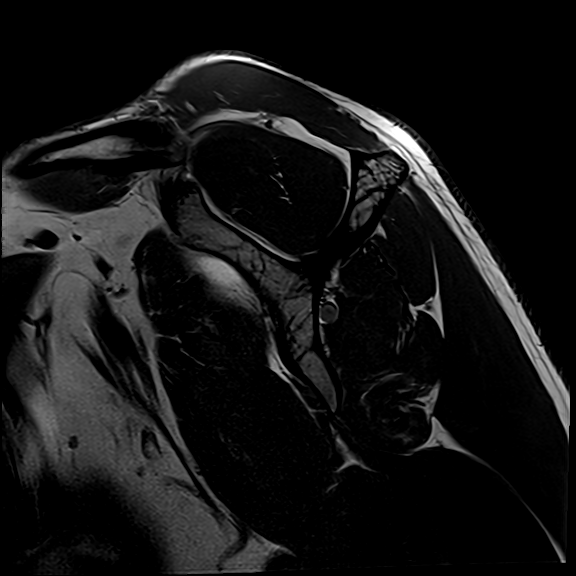

[17 of 40 positions shown; findings below may reference images not displayed]

FINDINGS: Mild insertional tendinosis of the supraspinatus and infraspinatus tendons. There is a low-grade partial-thickness articular sided insertional tear in the region of the confluence. Mild insertional
tendinosis of subscapularis without tear. Teres minor is unremarkable   The tendon of the long head of the biceps muscle is also intact and well positioned within the bicipital groove of the humerus.
The acromioclavicular joint mild DJD. Downsloping acromion. Mild mass effect. Correlate for impingement.
Small amount of fluid subacromial/subdeltoid bursa consistent with mild bursitis.
Labrum is intact.
The articular cartilage of the glenohumeral joint is normal.
Osseous structures demonstrate no fractures or destructive lesions.
No muscle atrophy. No denervation edema. No cystic or solid masses. No visible adenopathy.
IMPRESSION: 1.  Mild insertional tendinosis of the supraspinatus and infraspinatus tendons with low-grade partial-thickness articular sided insertional tear in the region of the confluence.
2.  Mild insertional tendinosis of subscapularis without tear.
3.  Mild acromioclavicular DJD. Downsloping acromion. Mild mass effect. Correlate for impingement.
4.  Mild subacromial/subdeltoid bursitis.
